# Patient Record
Sex: Male | Born: 1980 | Race: Black or African American | Hispanic: No | Marital: Married | Smoking: Never smoker
Health system: Southern US, Community
[De-identification: ages and names within clinical notes are randomized; demographics above are authoritative.]

## PROBLEM LIST (undated history)

## (undated) DIAGNOSIS — I10 Essential (primary) hypertension: Secondary | ICD-10-CM

---

## 2016-01-06 DIAGNOSIS — I10 Essential (primary) hypertension: Secondary | ICD-10-CM | POA: Insufficient documentation

## 2017-04-09 ENCOUNTER — Encounter (HOSPITAL_COMMUNITY): Payer: Self-pay

## 2017-04-09 ENCOUNTER — Emergency Department (HOSPITAL_COMMUNITY): Payer: Medicare Other

## 2017-04-09 ENCOUNTER — Emergency Department (HOSPITAL_COMMUNITY)
Admission: EM | Admit: 2017-04-09 | Discharge: 2017-04-09 | Disposition: A | Discharge status: Home / Self Care | Payer: Medicare Other | Attending: Emergency Medicine | Admitting: Emergency Medicine

## 2017-04-09 DIAGNOSIS — Y929 Unspecified place or not applicable: Secondary | ICD-10-CM | POA: Diagnosis not present

## 2017-04-09 DIAGNOSIS — I10 Essential (primary) hypertension: Secondary | ICD-10-CM | POA: Diagnosis not present

## 2017-04-09 DIAGNOSIS — S39012A Strain of muscle, fascia and tendon of lower back, initial encounter: Secondary | ICD-10-CM | POA: Diagnosis not present

## 2017-04-09 DIAGNOSIS — Y939 Activity, unspecified: Secondary | ICD-10-CM | POA: Insufficient documentation

## 2017-04-09 DIAGNOSIS — X58XXXA Exposure to other specified factors, initial encounter: Secondary | ICD-10-CM | POA: Diagnosis not present

## 2017-04-09 DIAGNOSIS — S3992XA Unspecified injury of lower back, initial encounter: Secondary | ICD-10-CM | POA: Diagnosis present

## 2017-04-09 DIAGNOSIS — Y999 Unspecified external cause status: Secondary | ICD-10-CM | POA: Diagnosis not present

## 2017-04-09 HISTORY — DX: Essential (primary) hypertension: I10

## 2017-04-09 MED ORDER — METHOCARBAMOL 500 MG PO TABS: 1000.0000 mg | ORAL_TABLET | Freq: Three times a day (TID) | ORAL | 0 refills | Status: DC | PRN

## 2017-04-09 MED ORDER — ACETAMINOPHEN 500 MG PO TABS
1000.0000 mg | ORAL_TABLET | Freq: Once | ORAL | Status: AC
  Administered 2017-04-09: 1000 mg via ORAL
  Filled 2017-04-09: qty 2

## 2017-04-09 MED ORDER — METHOCARBAMOL 500 MG PO TABS
1000.0000 mg | ORAL_TABLET | Freq: Once | ORAL | Status: AC
  Administered 2017-04-09: 1000 mg via ORAL
  Filled 2017-04-09: qty 2

## 2017-04-09 NOTE — ED Triage Notes (Signed)
Patient c/o left lower back pain that radiates into the mid back.Patient deneis any pain that radiates into the left leg or numbness, tingling of the left leg. Patient also reports that he has been taking motrin and OTC pain patches with no relief.
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
## 2017-04-09 NOTE — Discharge Instructions (Signed)
It was our pleasure to provide your ER care today - we hope that you feel better.  Take motrin or aleve as need for pain.  You may also take robaxin as need for pain - no driving when taking.  Also try gentle massage and heat therapy for symptom relief.  Follow up with primary care doctor in the next 1-2 weeks.

## 2018-04-07 ENCOUNTER — Encounter (HOSPITAL_COMMUNITY): Payer: Self-pay | Admitting: Physician Assistant

## 2018-04-07 ENCOUNTER — Ambulatory Visit (HOSPITAL_COMMUNITY)
Admission: EM | Admit: 2018-04-07 | Discharge: 2018-04-07 | Disposition: A | Discharge status: Home / Self Care | Payer: Medicare Other | Attending: Family Medicine | Admitting: Family Medicine

## 2018-04-07 DIAGNOSIS — M7662 Achilles tendinitis, left leg: Secondary | ICD-10-CM | POA: Diagnosis not present

## 2018-04-07 MED ORDER — MELOXICAM 15 MG PO TABS: 15.0000 mg | ORAL_TABLET | Freq: Every day | ORAL | 0 refills | Status: DC

## 2018-04-07 NOTE — ED Provider Notes (Signed)
[facility]    CSN: [id number] Arrival date & time: 04/07/18  1631     History   Chief Complaint Chief Complaint  Patient presents with  . Leg Pain    HPI Adam Brown is a 37 y.o. male.   37 year old male comes in for 1 day history of left heel pain that can radiate to the calf. States he woke up with the pain without injury or trauma. Pain causing him to have trouble walking. Denies calf swelling, erythema, increased warmth. Denies chest pain, shortness of breath. 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 Has tried elevating foot, which causes some numbness/tingling.  Has not taken anything for the symptoms.      Past Medical History:  Diagnosis Date  . Hypertension     There are no active problems to display for this patient.   History reviewed. No pertinent surgical history.     Home Medications    Prior to Admission medications   Medication Sig Start Date End Date Taking? Authorizing Provider  AMLODIPINE BESYLATE PO Take by mouth.   Yes [provider]  meloxicam (MOBIC) 15 MG tablet Take 1 tablet (15 mg total) by mouth daily. 04/07/18   Cathie HoopsYu, Amy V, PA-C  methocarbamol (ROBAXIN) 500 MG tablet Take 2 tablets (1,000 mg total) by mouth 3 (three) times daily as needed for muscle spasms. Patient not taking: Reported on 04/07/2018 04/09/17   Cathren LaineSteinl, Kevin, MD    Family History History reviewed. No pertinent family history.  Social History Social History   Tobacco Use  . Smoking status: Never Smoker  . Smokeless tobacco: Never Used  Substance Use Topics  . Alcohol use: No  . Drug use: No     Allergies   Patient has no known allergies.   Review of Systems Review of Systems  Reason unable to perform ROS: See HPI as above.     Physical Exam Triage Vital Signs ED Triage Vitals [04/07/18 1648]  Enc  Vitals Group     BP 115/73     Pulse Rate 67     Resp 16     Temp 98.8 F (37.1 C)     Temp src      SpO2 100 %     Weight      Height      Head Circumference      Peak Flow      Pain Score      Pain Loc      Pain Edu?      Excl. in GC?    No data found.  Updated Vital Signs BP 115/73   Pulse 67   Temp 98.8 F (37.1 C)   Resp 16   SpO2 100%   Physical Exam  Constitutional: He is oriented to person, place, and time. He appears well-developed and well-nourished. No distress.  HENT:  Head: Normocephalic and atraumatic.  Eyes: Pupils are equal, round, and reactive to light. Conjunctivae are normal.  Musculoskeletal:  No swelling, erythema, increased warmth, contusion. No tenderness to palpation of the calf. Tenderness to palpation of posterior heel along achilles tendon. Full ROM of ankle. Strength normal and equal bilaterally. Sensation intact and equal bilaterally. Pedal pulse 2+, cap refill <2s  Neurological: He is alert and oriented to person, place, and time.  UC Treatments / Results  Labs (all labs ordered are listed, but only abnormal results are displayed) Labs Reviewed - No data to display  EKG None  Radiology No results found.  Procedures Procedures (including critical care time)  Medications Ordered in UC Medications - No data to display  Initial Impression / Assessment and Plan / UC Course  I have reviewed the triage vital signs and the nursing notes.  Pertinent labs & imaging results that were available during my care of the patient were reviewed by me and considered in my medical decision making (see chart for details).    Will treat for Achilles tendinitis with Mobic.  Ice compress.  Will try Ace wrap.  Return precautions given.  Patient expresses understanding and agrees to plan.  Final Clinical Impressions(s) / UC Diagnoses   Final diagnoses:  Achilles tendinitis of left lower extremity    ED Prescriptions    Medication Sig  Dispense Auth. Provider   meloxicam (MOBIC) 15 MG tablet Take 1 tablet (15 mg total) by mouth daily. 15 tablet Threasa Alpha, [place] 04/07/18 1707

## 2018-04-07 NOTE — ED Triage Notes (Signed)
Pt states his left leg hurts when he walks, woke up with it today, denies injury. Pain in back of L heel.

## 2018-04-07 NOTE — Discharge Instructions (Signed)
Mobic as directed. Ice compress. Ace wrap may help symptoms with compressions. May take a few weeks to completely resolve, but should be improving each week. Follow up for reevaluation as needed.   Monitor for any calf swelling, redness, increased warmth, chest pain, shortness of breath, go to the emergency department for further evaluation needed.

## 2018-09-09 DIAGNOSIS — Z8249 Family history of ischemic heart disease and other diseases of the circulatory system: Secondary | ICD-10-CM | POA: Insufficient documentation

## 2019-10-13 ENCOUNTER — Ambulatory Visit (HOSPITAL_COMMUNITY)
Admission: EM | Admit: 2019-10-13 | Discharge: 2019-10-13 | Disposition: A | Discharge status: Home / Self Care | Payer: Medicare Other | Attending: Family Medicine | Admitting: Family Medicine

## 2019-10-13 ENCOUNTER — Other Ambulatory Visit: Payer: Self-pay

## 2019-10-13 DIAGNOSIS — M25562 Pain in left knee: Secondary | ICD-10-CM

## 2019-10-13 MED ORDER — PREDNISONE 10 MG (21) PO TBPK: ORAL_TABLET | ORAL | 0 refills | Status: DC

## 2019-10-13 NOTE — ED Notes (Signed)
Ace wrapped placed by provider

## 2019-10-13 NOTE — ED Triage Notes (Signed)
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 Was told to have left knee evaluated. No injury. CMS intact distally to left knee. Patient reports increased pain with adduction.

## 2019-10-13 NOTE — Discharge Instructions (Signed)
I believe that your symptoms are most likely related to osteoarthritis We will treat this with prednisone taper.  Take this with food. Rest, ice and elevate the knee. Ace wrap for compression Follow up as needed for continued or worsening symptoms

## 2019-10-13 NOTE — ED Provider Notes (Addendum)
[facility]    CSN: [id number] Arrival date & time: 10/13/19  0825      History   Chief Complaint Chief Complaint  Patient presents with  . Leg Pain    HPI Adam Brown is a 38 y.o. male.   Patient is a 38 year old male with past medical history of hypertension.  He is presenting today with left knee pain that has been becoming increasingly worse since Saturday.  He has had this knee pain for many months.  He did work on Saturday.  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  There has been generalized pain and swelling of the knee.  No injuries.  Unable to fully flex and extend the knee.  No fevers, chills.  No history of DVT.  ROS per HPI      Past Medical History:  Diagnosis Date  . Hypertension     There are no problems to display for this patient.   No past surgical history on file.     Home Medications    Prior to Admission medications   Medication Sig Start Date End Date Taking? Authorizing Provider  AMLODIPINE BESYLATE PO Take by mouth.    [provider]  predniSONE (STERAPRED UNI-PAK 21 TAB) 10 MG (21) TBPK tablet 6 tabs for 1 day, then 5 tabs for 1 das, then 4 tabs for 1 day, then 3 tabs for 1 day, 2 tabs for 1 day, then 1 tab for 1 day 10/13/19   Janace Aris, NP    Family History No family history on file.  Social History Social History   Tobacco Use  . Smoking status: Never Smoker  . Smokeless tobacco: Never Used  Substance Use Topics  . Alcohol use: No  . Drug use: No     Allergies   Patient has no known allergies.   Review of Systems Review of Systems   Physical Exam Triage Vital Signs ED Triage Vitals  Enc Vitals Group     BP 10/13/19 0850 123/87     Pulse Rate 10/13/19 0850 78     Resp 10/13/19 0850 17     Temp 10/13/19 0850 98 F (36.7 C)     Temp Source 10/13/19 0850 Oral     SpO2 10/13/19 0850 97 %     Weight --      Height --      Head Circumference --      Peak Flow --      Pain Score  10/13/19 0849 10     Pain Loc --      Pain Edu? --      Excl. in GC? --    No data found.  Updated Vital Signs BP 123/87 (BP Location: Right Arm)   Pulse 78   Temp 98 F (36.7 C) (Oral)   Resp 17   SpO2 97%   Visual Acuity Right Eye Distance:   Left Eye Distance:   Bilateral Distance:    Right Eye Near:   Left Eye Near:    Bilateral Near:     Physical Exam Vitals and nursing note reviewed.  Constitutional:      Appearance: Normal appearance.  HENT:     Head: Normocephalic and atraumatic.     Nose: Nose normal.  Eyes:     Conjunctiva/sclera: Conjunctivae normal.  Pulmonary:     Effort: Pulmonary effort is normal.  Musculoskeletal:        General: Normal range of motion.  Cervical back: Normal range of motion.     Right knee: Normal.     Left knee: Swelling and bony tenderness present. No crepitus. Tenderness present over the medial joint line, lateral joint line and patellar tendon. No LCL laxity, MCL laxity, ACL laxity or PCL laxity.Normal alignment, normal meniscus and normal patellar mobility.       Legs:  Skin:    General: Skin is warm and dry.  Neurological:     Mental Status: He is alert.  Psychiatric:        Mood and Affect: Mood normal.      UC Treatments / Results  Labs (all labs ordered are listed, but only abnormal results are displayed) Labs Reviewed - No data to display  EKG   Radiology No results found.  Procedures Procedures (including critical care time)  Medications Ordered in UC Medications - No data to display  Initial Impression / Assessment and Plan / UC Course  I have reviewed the triage vital signs and the nursing notes.  Pertinent labs & imaging results that were available during my care of the patient were reviewed by me and considered in my medical decision making (see chart for details).     Left knee pain-this is chronic for him but worsening over the past couple days.  Most likely symptoms are related to  osteoarthritis Ace wrap applied here in clinic.  Will have patient rest, ice and elevate. Prednisone taper for pain, swelling and inflammation Follow up as needed for continued or worsening symptoms  Final Clinical Impressions(s) / UC Diagnoses   Final diagnoses:  Acute pain of left knee     Discharge Instructions     I believe that your symptoms are most likely related to osteoarthritis We will treat this with prednisone taper.  Take this with food. Rest, ice and elevate the knee. Ace wrap for compression Follow up as needed for continued or worsening symptoms     ED Prescriptions    Medication Sig Dispense Auth. Provider   predniSONE (STERAPRED UNI-PAK 21 TAB) 10 MG (21) TBPK tablet 6 tabs for 1 day, then 5 tabs for 1 das, then 4 tabs for 1 day, then 3 tabs for 1 day, 2 tabs for 1 day, then 1 tab for 1 day 21 tablet Fraidy Mccarrick A, NP     PDMP not reviewed this encounter.   Orvan July, NP 10/13/19 1139    Loura Halt A, NP 10/13/19 1140

## 2019-11-22 ENCOUNTER — Ambulatory Visit (HOSPITAL_COMMUNITY)
Admission: EM | Admit: 2019-11-22 | Discharge: 2019-11-22 | Disposition: A | Discharge status: Home / Self Care | Payer: Medicare Other | Attending: Internal Medicine | Admitting: Internal Medicine

## 2019-11-22 ENCOUNTER — Encounter (HOSPITAL_COMMUNITY): Payer: Self-pay

## 2019-11-22 ENCOUNTER — Ambulatory Visit (INDEPENDENT_AMBULATORY_CARE_PROVIDER_SITE_OTHER): Payer: Medicare Other

## 2019-11-22 ENCOUNTER — Other Ambulatory Visit: Payer: Self-pay

## 2019-11-22 DIAGNOSIS — J1282 Pneumonia due to coronavirus disease 2019: Secondary | ICD-10-CM

## 2019-11-22 DIAGNOSIS — U071 COVID-19: Secondary | ICD-10-CM

## 2019-11-22 LAB — POC SARS CORONAVIRUS 2 AG: SARS Coronavirus 2 Ag: POSITIVE — AB

## 2019-11-22 LAB — POC SARS CORONAVIRUS 2 AG -  ED: SARS Coronavirus 2 Ag: POSITIVE — AB

## 2019-11-22 MED ORDER — BENZONATATE 100 MG PO CAPS: 100.0000 mg | ORAL_CAPSULE | Freq: Three times a day (TID) | ORAL | 0 refills | Status: AC

## 2019-11-22 MED ORDER — ONDANSETRON 4 MG PO TBDP: 4.0000 mg | ORAL_TABLET | Freq: Three times a day (TID) | ORAL | 0 refills | Status: AC | PRN

## 2019-11-22 MED ORDER — ACETAMINOPHEN 325 MG PO TABS
ORAL_TABLET | ORAL | Status: AC
  Filled 2019-11-22: qty 2

## 2019-11-22 MED ORDER — IBUPROFEN 600 MG PO TABS: 600.0000 mg | ORAL_TABLET | Freq: Four times a day (QID) | ORAL | 0 refills | Status: AC | PRN

## 2019-11-22 MED ORDER — ACETAMINOPHEN 325 MG PO TABS
650.0000 mg | ORAL_TABLET | Freq: Once | ORAL | Status: AC
  Administered 2019-11-22: 650 mg via ORAL
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
## 2019-11-23 ENCOUNTER — Telehealth: Payer: Self-pay | Admitting: Infectious Diseases

## 2019-11-23 ENCOUNTER — Encounter: Payer: Self-pay | Admitting: Infectious Diseases

## 2019-11-23 NOTE — ED Provider Notes (Addendum)
[facility]    CSN: [id number] Arrival date & time: 11/22/19  1718      History   Chief Complaint Chief Complaint  Patient presents with  . Cough  . Headache    HPI Adam Brown is a 39 y.o. male with a history of hypertension comes to urgent care with nonproductive cough, generalized body aches, sore throat, loss of taste and smell.   Patient symptoms started yesterday and is gotten progressively worse.  Patient started having some fever and chills today.  He has diarrhea with no nausea or vomiting.  He admits to having some shortness of breath on exertion.  On evaluation in the urgent care patient has a temperature of 103 Fahrenheit.  He has not tried any over-the-counter medications.  Patient has been exposed to Covid positive weight loss in his family.  His daughters have been diagnosed with COVID-19 and his wife is currently experiencing flulike symptoms.  HPI  Past Medical History:  Diagnosis Date  . Hypertension     Patient Active Problem List   Diagnosis Date Noted  . Family history of heart disease 09/09/2018  . Severe obesity (BMI 35.0-35.9 with comorbidity) (HCC) 07/30/2017  . Essential hypertension 01/06/2016    History reviewed. No pertinent surgical history.     Home Medications    Prior to Admission medications   Medication Sig Start Date End Date Taking? Authorizing Provider  AMLODIPINE BESYLATE PO Take by mouth.   Yes [provider]  benzonatate (TESSALON) 100 MG capsule Take 1 capsule (100 mg total) by mouth every 8 (eight) hours. 11/22/19   Merrilee Jansky, MD  ibuprofen (ADVIL) 600 MG tablet Take 1 tablet (600 mg total) by mouth every 6 (six) hours as needed. 11/22/19   Merrilee Jansky, MD  ondansetron (ZOFRAN ODT) 4 MG disintegrating tablet Take 1 tablet (4 mg total) by mouth every 8 (eight) hours as needed for nausea or vomiting. 11/22/19   Boby Eyer, Britta Mccreedy, MD    Family History History reviewed. No pertinent family  history.  Social History Social History   Tobacco Use  . Smoking status: Never Smoker  . Smokeless tobacco: Never Used  Substance Use Topics  . Alcohol use: No  . Drug use: No     Allergies   Patient has no known allergies.   Review of Systems Review of Systems  Constitutional: Positive for appetite change, chills and fatigue. Negative for unexpected weight change.  HENT: Positive for sore throat. Negative for rhinorrhea, sinus pressure and sinus pain.   Eyes: Negative for discharge and itching.  Respiratory: Positive for cough and shortness of breath. Negative for chest tightness and wheezing.   Cardiovascular: Negative for chest pain and palpitations.  Gastrointestinal: Positive for diarrhea. Negative for abdominal pain, nausea and vomiting.  Endocrine: Negative for polydipsia.  Genitourinary: Negative for dysuria, hematuria and urgency.  Musculoskeletal: Positive for arthralgias and myalgias. Negative for neck pain and neck stiffness.  Neurological: Positive for headaches. Negative for dizziness and light-headedness.  Psychiatric/Behavioral: Negative for confusion and decreased concentration.     Physical Exam Triage Vital Signs ED Triage Vitals  Enc Vitals Group     BP 11/22/19 1831 137/75     Pulse Rate 11/22/19 1831 (!) 107     Resp 11/22/19 1831 20     Temp 11/22/19 1831 (!) 103 F (39.4 C)     Temp Source 11/22/19 1831 Oral     SpO2 11/22/19 1831 93 %  Weight --      Height --      Head Circumference --      Peak Flow --      Pain Score 11/22/19 1829 0     Pain Loc --      Pain Edu? --      Excl. in [place]? --    No data found.  Updated Vital Signs BP 137/75 (BP Location: Left Arm)   Pulse (!) 107   Temp (!) 103 F (39.4 C) (Oral)   Resp 20   SpO2 93%   Visual Acuity Right Eye Distance:   Left Eye Distance:   Bilateral Distance:    Right Eye Near:   Left Eye Near:    Bilateral Near:     Physical Exam Vitals and nursing note reviewed.    Constitutional:      General: He is in acute distress.     Appearance: He is ill-appearing.  HENT:     Mouth/Throat:     Mouth: Mucous membranes are moist.  Eyes:     Extraocular Movements: Extraocular movements intact.     Right eye: Normal extraocular motion.     Left eye: Normal extraocular motion.     Pupils: Pupils are equal, round, and reactive to light.  Cardiovascular:     Rate and Rhythm: Normal rate and regular rhythm.     Heart sounds: Normal heart sounds. No murmur. No friction rub.  Pulmonary:     Effort: Pulmonary effort is normal. No respiratory distress.     Breath sounds: No rhonchi or rales.  Chest:     Chest wall: No tenderness.  Abdominal:     General: There is distension.     Palpations: There is no mass.  Musculoskeletal:        General: No swelling or tenderness. Normal range of motion.     Cervical back: Normal range of motion and neck supple. No rigidity.  Lymphadenopathy:     Cervical: No cervical adenopathy.  Skin:    General: Skin is warm.     Capillary Refill: Capillary refill takes less than 2 seconds.     Findings: No erythema or rash.  Neurological:     Mental Status: He is alert and oriented to person, place, and time.     GCS: GCS eye subscore is 4. GCS verbal subscore is 5. GCS motor subscore is 6.     Sensory: No sensory deficit.     Motor: No weakness.     Gait: Gait normal.     Deep Tendon Reflexes: Reflexes normal.  Psychiatric:        Mood and Affect: Mood normal. Mood is not anxious or depressed.      UC Treatments / Results  Labs (all labs ordered are listed, but only abnormal results are displayed) Labs Reviewed  POC SARS CORONAVIRUS 2 AG -  ED - Abnormal; Notable for the following components:      Result Value   SARS Coronavirus 2 Ag POSITIVE (*)    All other components within normal limits  POC SARS CORONAVIRUS 2 AG - Abnormal; Notable for the following components:   SARS Coronavirus 2 Ag POSITIVE (*)    All other  components within normal limits    EKG   Radiology DG Chest 2 View  Result Date: 11/22/2019 CLINICAL DATA:  Shortness of breath. EXAM: CHEST - 2 VIEW COMPARISON:  None. FINDINGS: Mild infiltrates are seen within the bilateral lung bases and  mid right lung. There is no evidence of a pleural effusion or pneumothorax. The heart size and mediastinal contours are within normal limits. The visualized skeletal structures are unremarkable. IMPRESSION: 1. Mild bilateral infiltrates, right greater than left. Electronically Signed   By: Aram Candela M.D.   On: 11/22/2019 19:01    Procedures Procedures (including critical care time)  Medications Ordered in UC Medications  acetaminophen (TYLENOL) tablet 650 mg (650 mg Oral Given 11/22/19 1843)    Initial Impression / Assessment and Plan / UC Course  I have reviewed the triage vital signs and the nursing notes.  Pertinent labs & imaging results that were available during my care of the patient were reviewed by me and considered in my medical decision making (see chart for details).     1.  COVID-19 pneumonia: Chest x-ray is significant for bilateral infiltrates right greater than left Rapid COVID-19 test is positive Patient's pulse oximetry is 93% on room air with ambulation.  He is not tachypneic at this time.  He is able to ambulate with mild shortness of breath. Tessalon Perles as needed for cough Zofran as needed for nausea/vomiting Advil 600 mg as needed for generalized body aches Patient is advised to reach out to the urgent care division via virtual visit or in person if his symptoms is not improving or if symptoms worsen. Patient is advised to self isolate for 10 days per [facility] recommendations. Final Clinical Impressions(s) / UC Diagnoses   Final diagnoses:  Pneumonia due to COVID-19 virus   Discharge Instructions   None    ED Prescriptions    Medication Sig Dispense Auth. Provider   benzonatate (TESSALON) 100 MG capsule  Take 1 capsule (100 mg total) by mouth every 8 (eight) hours. 21 capsule Niley Helbig, Britta Mccreedy, MD   ondansetron (ZOFRAN ODT) 4 MG disintegrating tablet Take 1 tablet (4 mg total) by mouth every 8 (eight) hours as needed for nausea or vomiting. 20 tablet Draden Cottingham, Britta Mccreedy, MD   ibuprofen (ADVIL) 600 MG tablet Take 1 tablet (600 mg total) by mouth every 6 (six) hours as needed. 30 tablet Frazer [place], Britta Mccreedy, MD     PDMP not reviewed this encounter.   Merrilee Jansky, MD 11/23/19 1233    Merrilee Jansky, MD 11/23/19 1234    Merrilee Jansky, MD 11/23/19 1245

## 2019-11-23 NOTE — Telephone Encounter (Signed)
Called to discuss with patient about Covid symptoms and the use of bamlanivimab, a monoclonal antibody infusion for those with mild to moderate Covid symptoms and at a high risk of hospitalization.  Pt is qualified for this infusion at the [facility] infusion center due to BMI>35.    Message left to call back. He had onset of symptoms 1/22 with mild infiltrates on CXR in ER visit.

## 2019-11-24 ENCOUNTER — Telehealth: Payer: Self-pay | Admitting: Adult Health

## 2019-11-24 NOTE — Telephone Encounter (Signed)
Spoke with patient regarding therapy for COVID19.  His symptom onset was on 11/21/2019, his BMI is 37, he has hypertension.  I reviewed with him monoclonal antibody treatment with bamlanivimab.  Unfortunately, our call was disconnected.  I called back, received his voice mail, and asked him to return my call so that we can discuss further.  Lillard Anes, NP

## 2021-07-01 IMAGING — DX DG CHEST 2V
2 series · 2 of 2 positions shown · non-contrast
Comparison: None.

CLINICAL DATA: Shortness of breath.

EXAM:
CHEST - 2 VIEW

[chest pa]
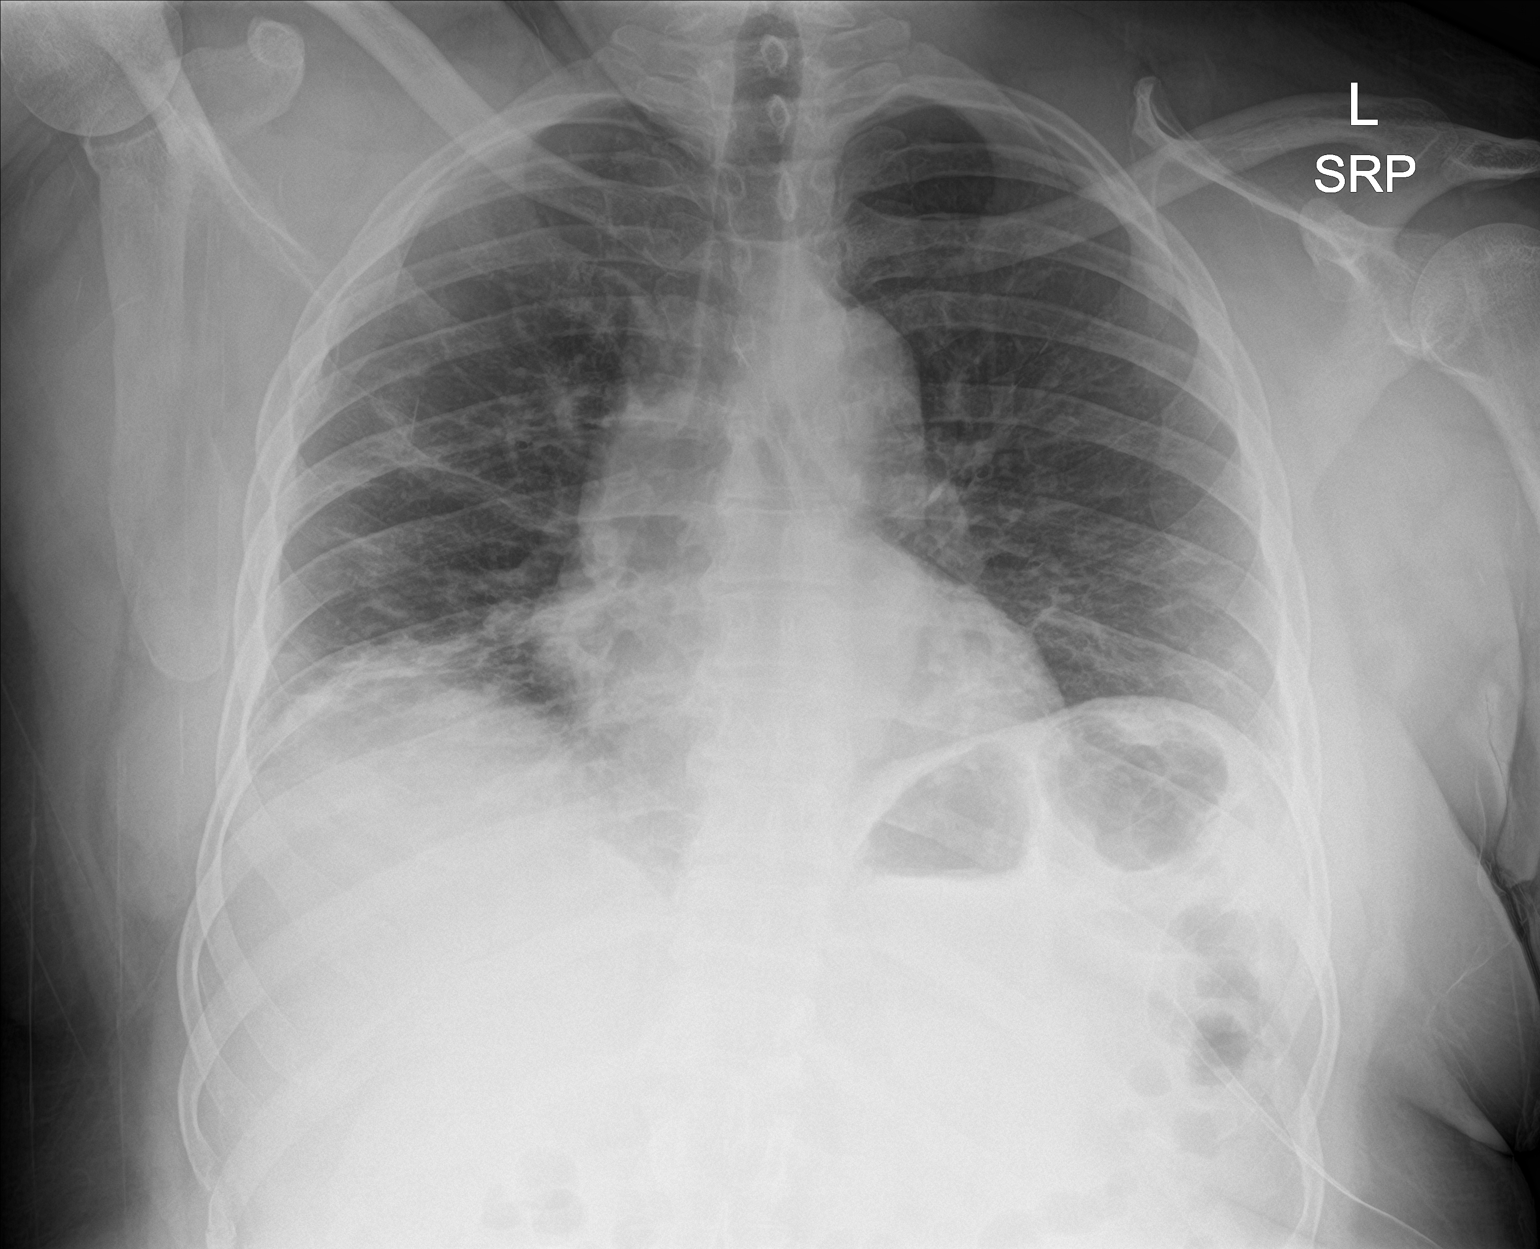

[chest lat]
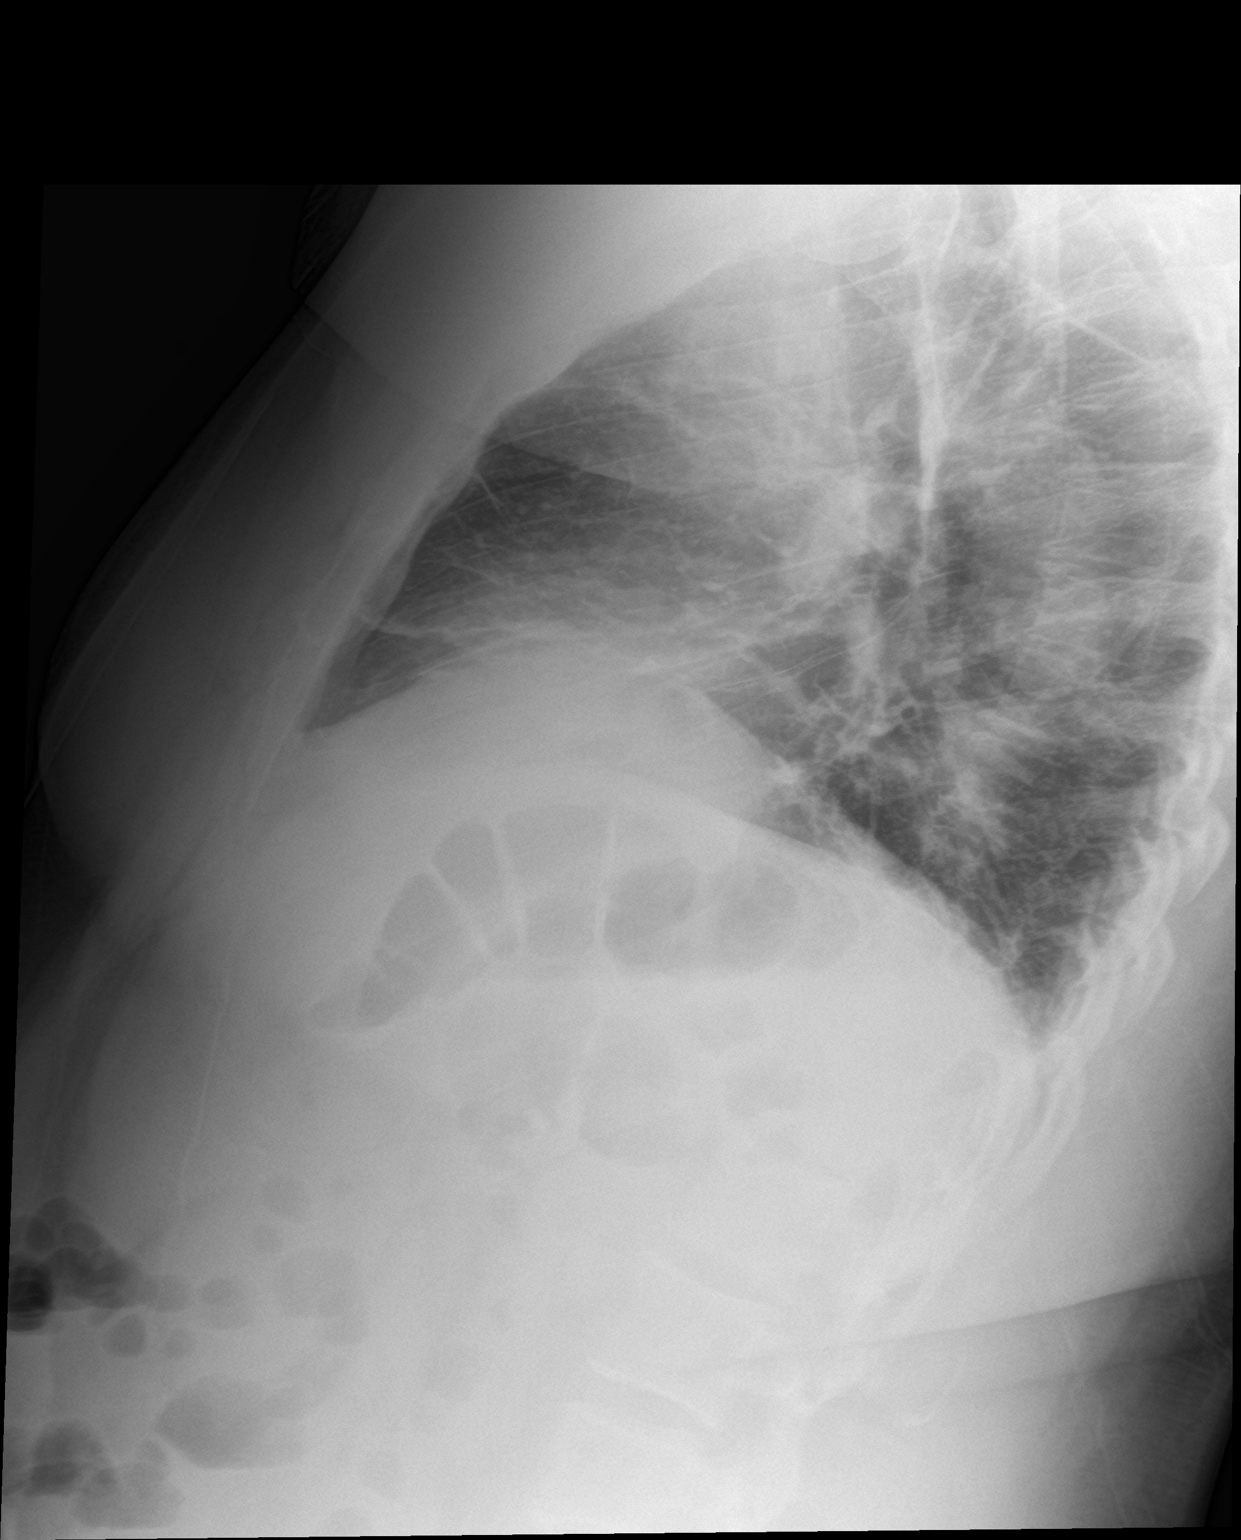

[2 of 2 positions shown; findings below may reference images not displayed]

FINDINGS: Mild infiltrates are seen within the bilateral lung bases and mid
right lung. There is no evidence of a pleural effusion or
pneumothorax. The heart size and mediastinal contours are within
normal limits. The visualized skeletal structures are unremarkable.
IMPRESSION: 1. Mild bilateral infiltrates, right greater than left.
